# Patient Record
Sex: Male | Born: 1970 | Race: White | Hispanic: No | Marital: Married
Health system: Southern US, Academic
[De-identification: ages and names within clinical notes are randomized; demographics above are authoritative.]

---

## 1996-02-04 ENCOUNTER — Other Ambulatory Visit (HOSPITAL_COMMUNITY): Payer: Self-pay | Admitting: INTERNAL MEDICINE

## 2021-05-08 IMAGING — MR MRI LUMBAR SPINE WITHOUT CONTRAST
6 series · 48 of 48 positions shown · IV contrast (gadolinium)
Comparison: None available.

﻿EXAM:  [id number]   MRI LUMBAR SPINE WITHOUT CONTRAST
INDICATION: Lower back pain with bilateral lower extremity radiculopathy.
TECHNIQUE: Multiplanar multisequential MRI of the lumbar spine was performed without gadolinium contrast.

[Series 4: T2 · sagittal · 5.0mm · 1.00mm/px · 5 of 13 slices shown (1 of 3)]
[im 1/13]
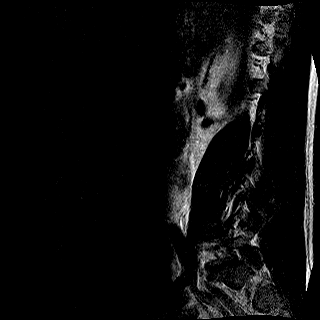
[im 4/13]
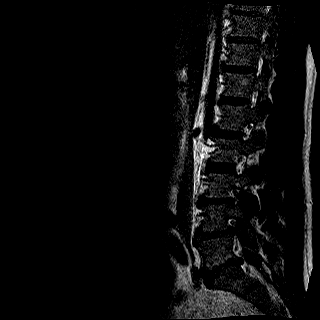
[im 7/13]
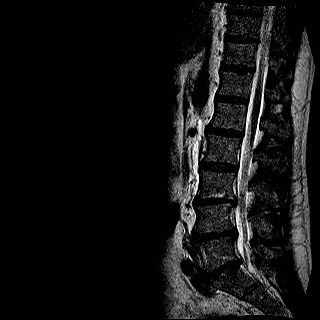
[im 10/13]
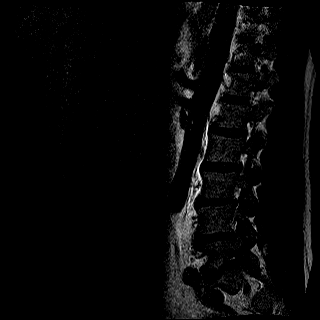
[im 13/13]
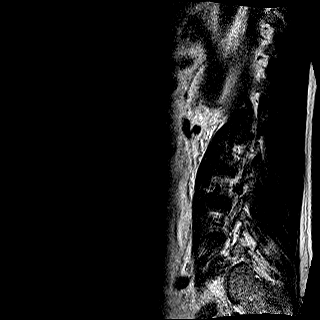

[Series 5: T1 · sagittal · 5.0mm · 1.00mm/px · 6 of 13 slices shown (1 of 2)]
[im 1/13]
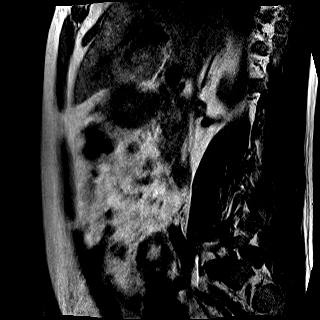
[im 3/13]
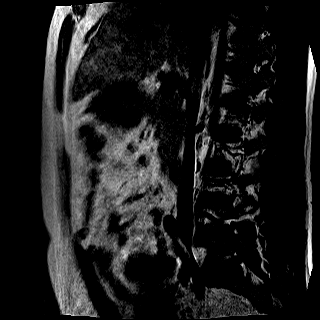
[im 5/13]
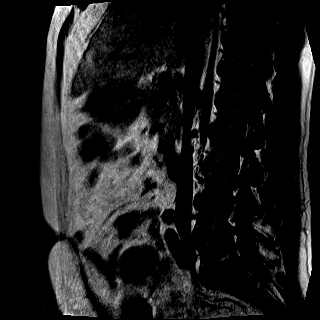
[im 8/13]
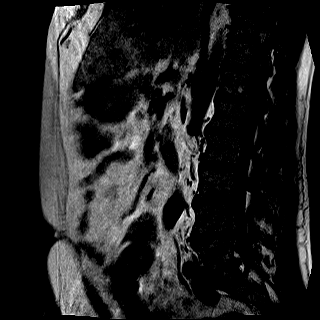
[im 10/13]
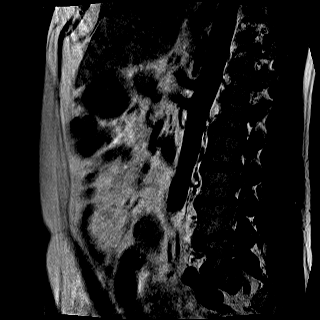
[im 13/13]
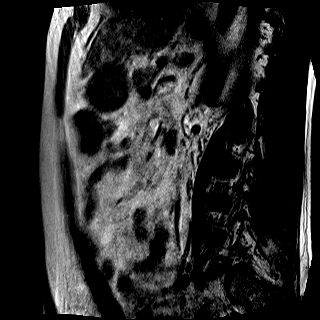

[Series 6: STIR · sagittal · 5.0mm · 1.25mm/px · 6 of 13 slices shown]
[im 1/13]
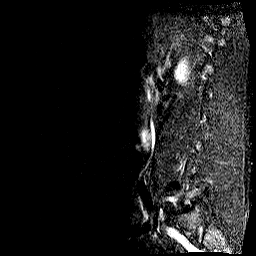
[im 3/13]
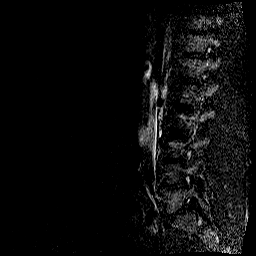
[im 5/13]
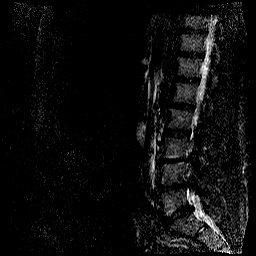
[im 8/13]
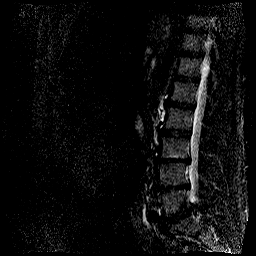
[im 10/13]
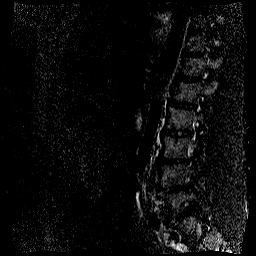
[im 13/13]
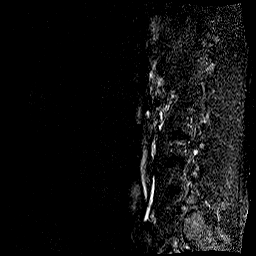

[Series 7: T2 · axial · 5.0mm · 0.89mm/px · z∈[-138,+50]mm · 11 of 25 slices shown (2 of 3)]
[im 1/25]
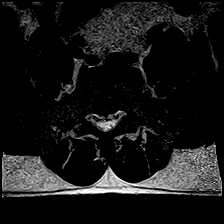
[im 3/25]
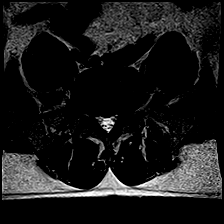
[im 5/25]
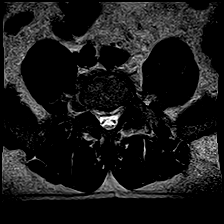
[im 8/25]
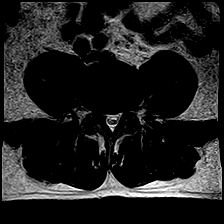
[im 10/25]
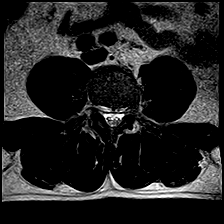
[im 13/25]
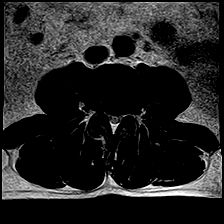
[im 15/25]
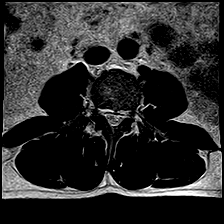
[im 17/25]
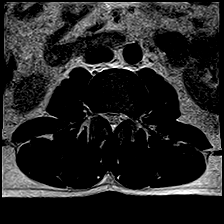
[im 20/25]
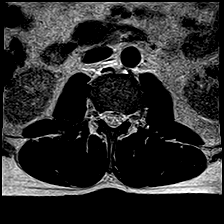
[im 22/25]
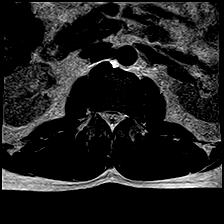
[im 25/25]
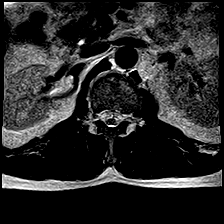

[Series 8: T1 · axial · 5.0mm · 0.89mm/px · z∈[-138,+50]mm · 11 of 25 slices shown (2 of 2)]
[im 1/25]
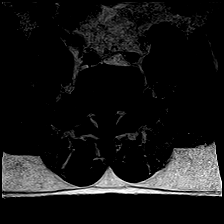
[im 3/25]
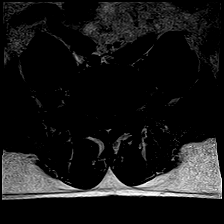
[im 5/25]
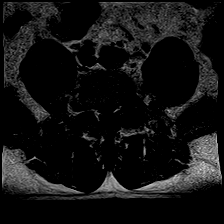
[im 8/25]
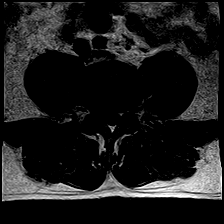
[im 10/25]
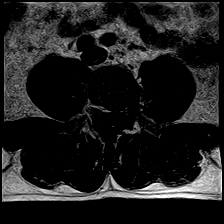
[im 13/25]
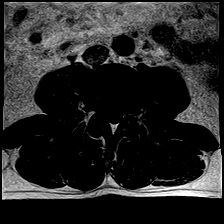
[im 15/25]
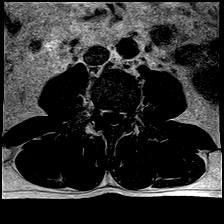
[im 17/25]
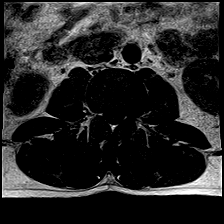
[im 20/25]
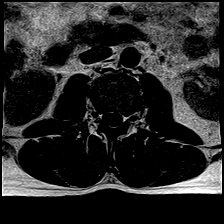
[im 22/25]
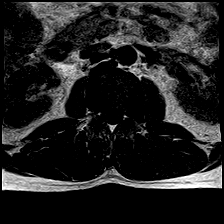
[im 25/25]
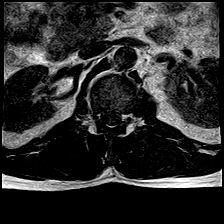

[Series 9: T2 · coronal · 4.0mm · 1.79mm/px · 9 of 20 slices shown (3 of 3)]
[im 1/20]
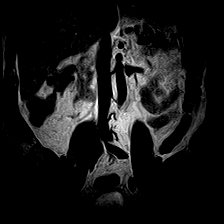
[im 3/20]
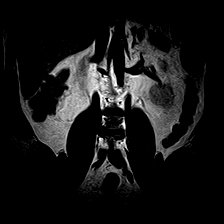
[im 5/20]
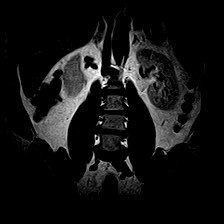
[im 8/20]
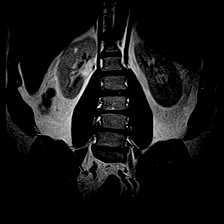
[im 10/20]
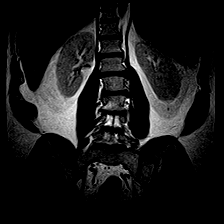
[im 12/20]
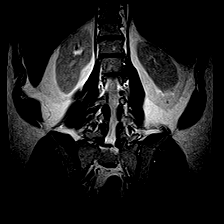
[im 15/20]
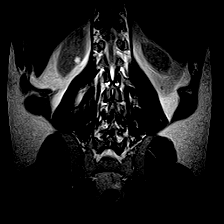
[im 17/20]
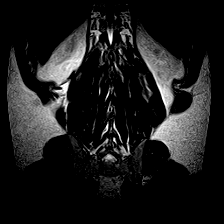
[im 20/20]
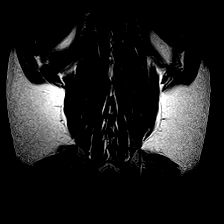

[48 of 48 positions shown; findings below may reference images not displayed]

FINDINGS: Vertebral bodies are normal in height, alignment and signal intensity. There is no acute fracture or subluxation. Distal spinal cord is normal in signal intensity and terminates normally at L1-2 disc space level. Spinal canal is congenitally narrow.

L1-2 level is unremarkable.

At L2-3 level, there is mild left neural foraminal stenosis from bulging annulus without nerve root impingement.

At L3-4 level, there is moderate to marked disc desiccation. There is a small broad-based central and left paracentral disc bulge resulting in moderate to severe spinal stenosis. There is moderate bilateral neural foraminal stenosis from facet arthropathy and bulging annulus.

At L4-5 level, there is moderate to marked disc desiccation. There is a small broad-based central disc bulge, mildly effacing the ventral thecal sac. There is moderate bilateral neural foraminal stenosis from facet arthropathy.

At L5-S1 level, there is moderate to marked disc desiccation. There is a small broad-based central disc bulge, minimally abutting the ventral thecal sac. There is moderate to severe bilateral neural foraminal stenosis from facet arthropathy and bulging annulus.

Paraspinal soft tissues are unremarkable.
IMPRESSION: 1. Small disc bulges at multiple levels with moderate to severe spinal stenosis at L3-4 level. 

2. Multilevel neural foraminal stenosis as detailed above.

## 2022-07-03 IMAGING — MR MRI LUMBAR SPINE WITHOUT CONTRAST
4 of 6 series · 30 of 48 positions shown · IV contrast (gadolinium)
Comparison: Previous MRI lumbar spine dated 05/08/2021.

﻿EXAM:  [id number]   MRI LUMBAR SPINE WITHOUT CONTRAST
INDICATION: 50-year-old with chronic low back pain. Radicular symptoms to both hips.  No prior back surgery.  No prior malignancy.
TECHNIQUE: Multiplanar, multisequential MRI of the lumbosacral spine was performed without gadolinium contrast.

[Series 12: T2 · sagittal · 4.0mm · 0.83mm/px · 6 of 13 slices shown (1 of 3)]
[im 1/13]
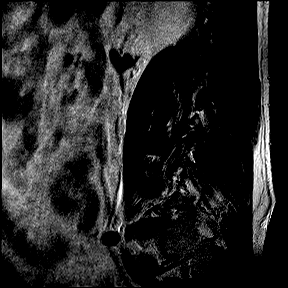
[im 3/13]
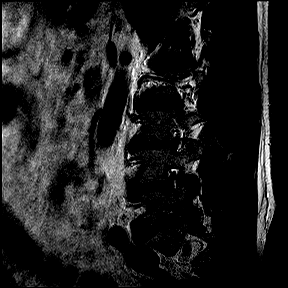
[im 5/13]
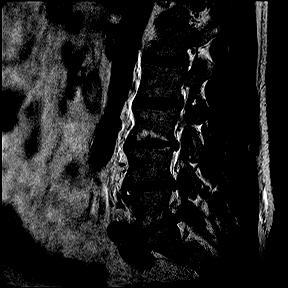
[im 8/13]
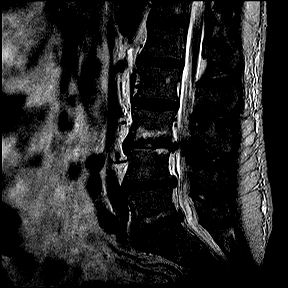
[im 10/13]
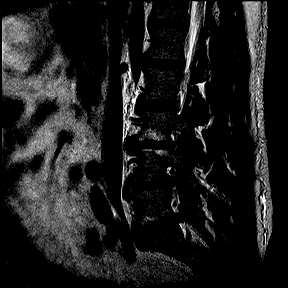
[im 13/13]
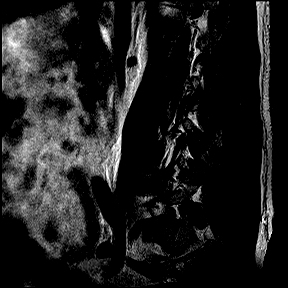

[Series 13: T1 · sagittal · 4.0mm · 0.83mm/px · 5 of 13 slices shown]
[im 1/13]
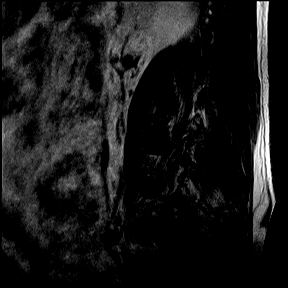
[im 3/13]
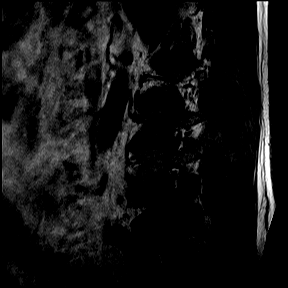
[im 5/13]
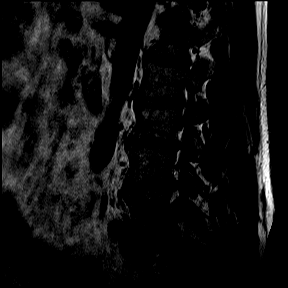
[im 8/13]
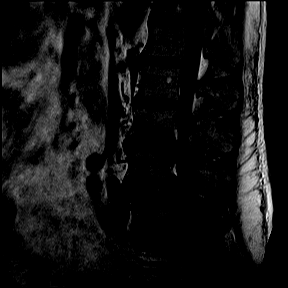
[im 13/13]
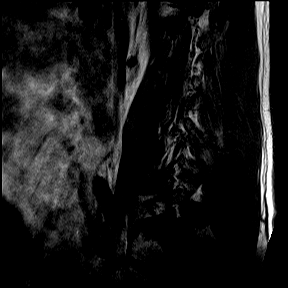

[Series 15: T2 · coronal · 5.0mm · 0.82mm/px · 8 of 18 slices shown (2 of 3)]
[im 1/18]
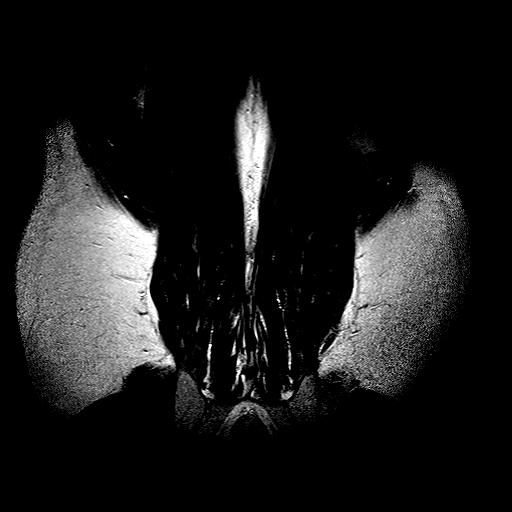
[im 3/18]
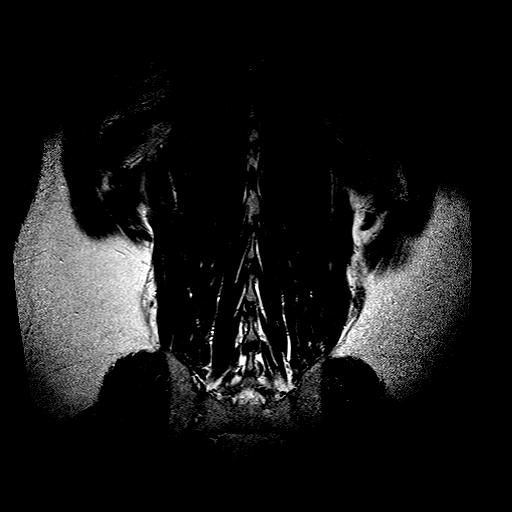
[im 5/18]
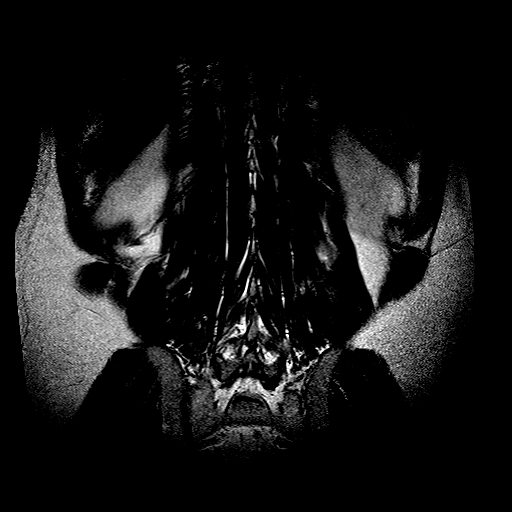
[im 8/18]
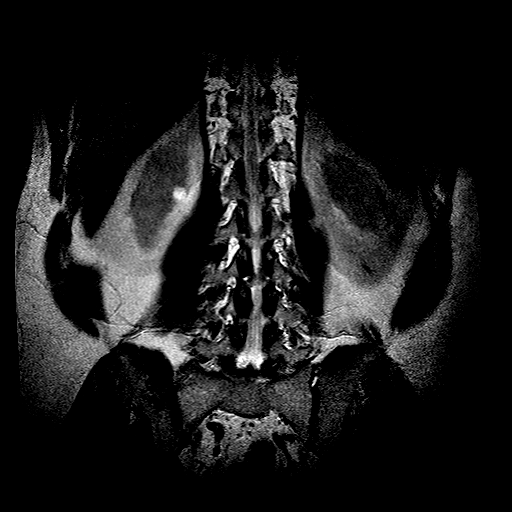
[im 10/18]
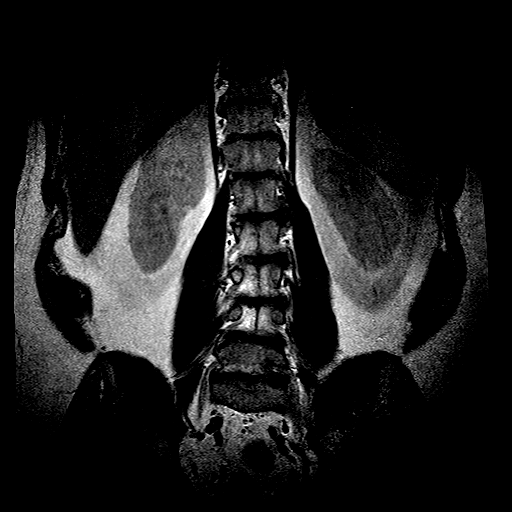
[im 13/18]
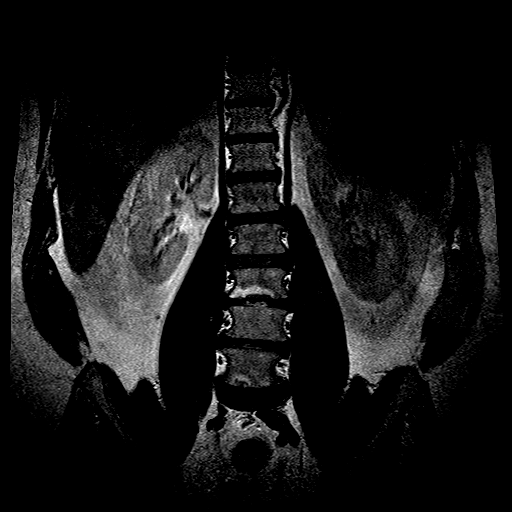
[im 15/18]
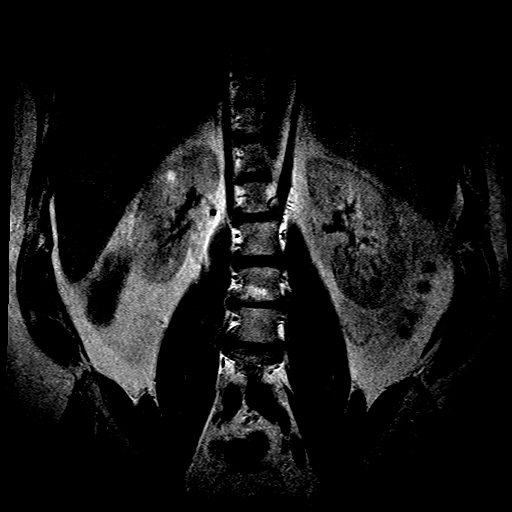
[im 18/18]
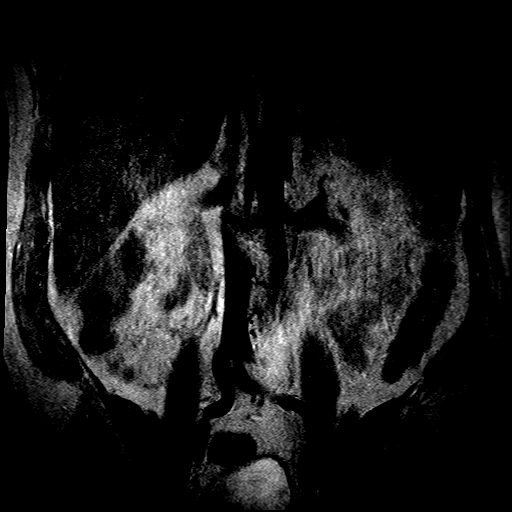

[Series 16: T2 · axial · 4.0mm · 0.52mm/px · z∈[-162,+35]mm · 11 of 23 slices shown (3 of 3)]
[im 1/23]
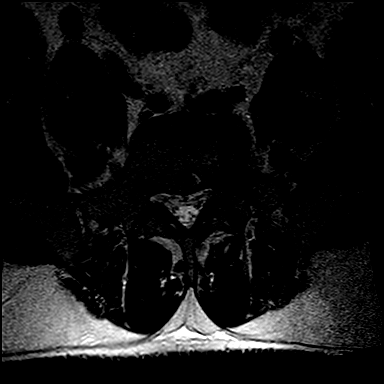
[im 3/23]
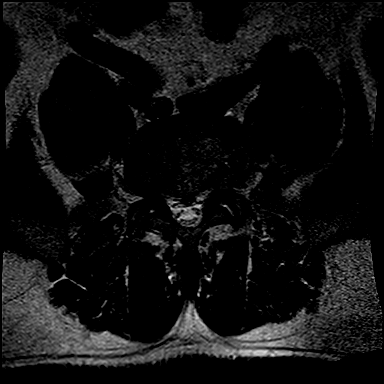
[im 5/23]
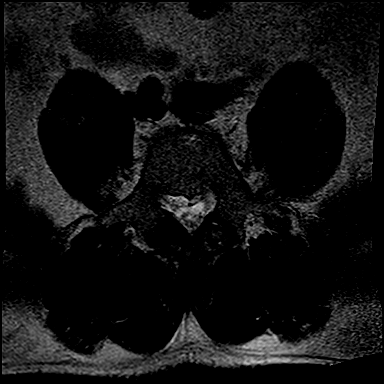
[im 7/23]
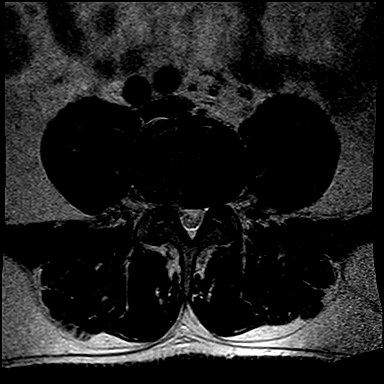
[im 9/23]
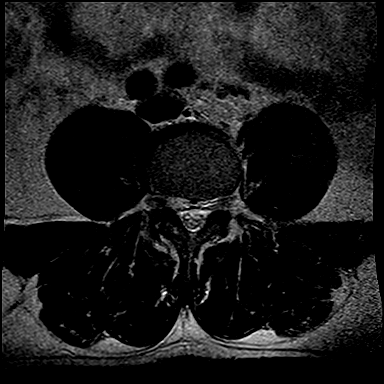
[im 12/23]
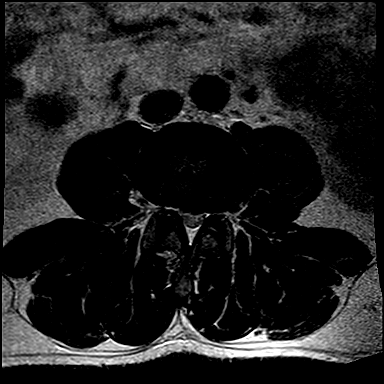
[im 14/23]
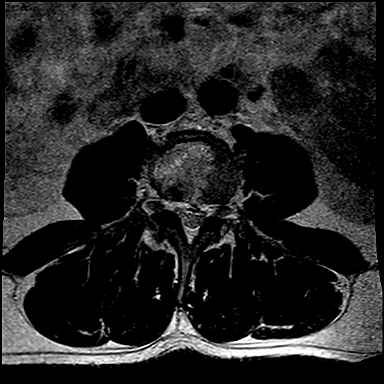
[im 16/23]
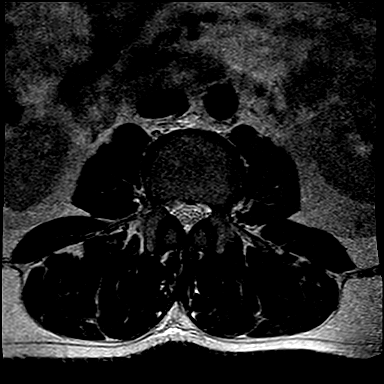
[im 18/23]
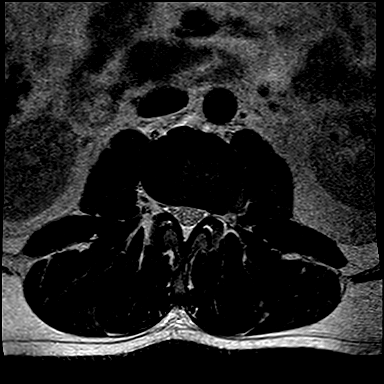
[im 20/23]
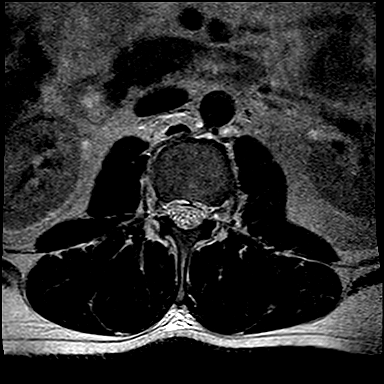
[im 23/23]
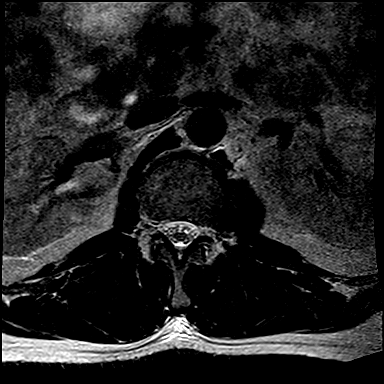

[30 of 48 positions shown; findings below may reference images not displayed]

FINDINGS: Overall quality of the images is slightly less than optimal due to large body habitus.  Quality is quite acceptable.

Lower spinal cord and cauda equina are normal. 

At L1-2 level, degenerative disc disease and facet arthropathy are causing mild compromise of both lateral recesses similar to prior study. 

At L2-3 level, bilateral facet arthropathy and moderately significant degenerative disc changes are causing mild compromise of thecal sac and moderate compromise of both lateral recesses and neural foramina similar to prior study.  AP diameter of thecal sac in the midline measures 10.2 mm. 

At L3-4 level, severe degenerative disc disease with bulging annulus is noted with increased Modic inflammatory changes at the lower L3 level compared with previous examination.  Small broad-based focal disc bulge is noted in the midline.  The disc changes and facet arthropathy are causing mild compromise of thecal sac and significant compromise of both lateral recesses and neural foramina. AP diameter of the thecal sac in the midline measures 7.3 mm.  No significant change is noted from previous examination of 05/08/2021 except for more prominent Modic changes at the inferior L3 vertebra as mentioned above. 

At L4-5 level, severe degenerative disc disease with bulging annulus is noted with asymmetric bulging to the right. Facet arthropathy is noted. Combination causes significant compromise of right neural foramen and moderate compromise of left foramen. Findings at L4-5 level are unchanged from prior study.

At L5-S1 level, significant degenerative disc disease with bulging annulus and facet arthropathy are noted causing significant biforaminal stenosis.  Findings at L5-S1 level are unchanged from prior findings on 05/08/2021.

Paravertebral soft tissues are unremarkable.
IMPRESSION: 1. Significant multilevel degenerative disc changes and facet changes as described above in detail at each level.  Most prominent findings are noted at L3-4, L4-5 and L5-S1 levels.

2. Overall, findings are unchanged from 05/08/2021 except for increased Modic inflammatory changes at the inferior L3 vertebra level.

## 2022-07-26 ENCOUNTER — Emergency Department
Admission: EM | Admit: 2022-07-26 | Discharge: 2022-07-26 | Disposition: A | Discharge status: Home / Self Care | Payer: 59 | Attending: Physician Assistant | Admitting: Physician Assistant

## 2022-07-26 ENCOUNTER — Other Ambulatory Visit: Payer: Self-pay

## 2022-07-26 ENCOUNTER — Encounter (HOSPITAL_COMMUNITY): Payer: Self-pay | Admitting: Physician Assistant

## 2022-07-26 ENCOUNTER — Emergency Department (HOSPITAL_COMMUNITY): Payer: 59

## 2022-07-26 DIAGNOSIS — Z981 Arthrodesis status: Secondary | ICD-10-CM | POA: Insufficient documentation

## 2022-07-26 DIAGNOSIS — M48061 Spinal stenosis, lumbar region without neurogenic claudication: Secondary | ICD-10-CM | POA: Insufficient documentation

## 2022-07-26 DIAGNOSIS — Y92410 Unspecified street and highway as the place of occurrence of the external cause: Secondary | ICD-10-CM

## 2022-07-26 DIAGNOSIS — M47812 Spondylosis without myelopathy or radiculopathy, cervical region: Secondary | ICD-10-CM | POA: Insufficient documentation

## 2022-07-26 DIAGNOSIS — M5126 Other intervertebral disc displacement, lumbar region: Secondary | ICD-10-CM | POA: Insufficient documentation

## 2022-07-26 MED ORDER — KETOROLAC 30 MG/ML (1 ML) INJECTION SOLUTION
30.0000 mg | INTRAMUSCULAR | Status: AC
Start: 2022-07-26 — End: 2022-07-26
  Administered 2022-07-26: 30 mg via INTRAVENOUS

## 2022-07-26 MED ORDER — KETOROLAC 30 MG/ML (1 ML) INJECTION SOLUTION
INTRAMUSCULAR | Status: AC
Start: 2022-07-26 — End: 2022-07-26
  Filled 2022-07-26: qty 1
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
## 2022-07-26 NOTE — Discharge Instructions (Signed)
CT of head and neck look good.  CT of lumbar spine may demonstrate a new disc herniation at L2.  Contact your neurosurgery group in the morning.  If you have new or worsening symptoms prior to that return to the emergency department
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
## 2022-10-14 ENCOUNTER — Other Ambulatory Visit: Payer: Self-pay

## 2022-10-14 ENCOUNTER — Ambulatory Visit
Admission: RE | Admit: 2022-10-14 | Discharge: 2022-10-14 | Disposition: A | Discharge status: Still In Hospital | Payer: 59 | Source: Ambulatory Visit

## 2022-10-16 ENCOUNTER — Ambulatory Visit (HOSPITAL_COMMUNITY): Payer: Self-pay
